# Patient Record
Sex: Female | Born: 1970 | Race: Black or African American | Hispanic: No | Smoking: Never smoker
Health system: Southern US, Community
[De-identification: ages and names within clinical notes are randomized; demographics above are authoritative.]

## PROBLEM LIST (undated history)

## (undated) DIAGNOSIS — E119 Type 2 diabetes mellitus without complications: Secondary | ICD-10-CM

---

## 2009-08-08 ENCOUNTER — Emergency Department (HOSPITAL_COMMUNITY): Admission: EM | Admit: 2009-08-08 | Discharge: 2009-08-08 | Payer: Self-pay | Admitting: Emergency Medicine

## 2009-08-20 ENCOUNTER — Emergency Department (HOSPITAL_COMMUNITY): Admission: EM | Admit: 2009-08-20 | Discharge: 2009-08-20 | Payer: Self-pay | Admitting: Family Medicine

## 2009-09-19 ENCOUNTER — Ambulatory Visit (HOSPITAL_COMMUNITY): Admission: RE | Admit: 2009-09-19 | Discharge: 2009-09-19 | Payer: Self-pay | Admitting: Obstetrics and Gynecology

## 2009-10-15 ENCOUNTER — Ambulatory Visit (HOSPITAL_COMMUNITY): Admission: RE | Admit: 2009-10-15 | Discharge: 2009-10-15 | Payer: Self-pay | Admitting: Obstetrics and Gynecology

## 2010-03-02 ENCOUNTER — Inpatient Hospital Stay (HOSPITAL_COMMUNITY)
Admission: AD | Admit: 2010-03-02 | Discharge: 2010-03-04 | Payer: Self-pay | Source: Home / Self Care | Admitting: Obstetrics and Gynecology

## 2010-06-11 LAB — CBC
Hemoglobin: 9.5 g/dL — ABNORMAL LOW (ref 12.0–15.0)
MCH: 29.2 pg (ref 26.0–34.0)
MCV: 86.8 fL (ref 78.0–100.0)
MCV: 87.5 fL (ref 78.0–100.0)
Platelets: 230 10*3/uL (ref 150–400)
Platelets: 246 10*3/uL (ref 150–400)
RDW: 14.7 % (ref 11.5–15.5)
WBC: 14.4 10*3/uL — ABNORMAL HIGH (ref 4.0–10.5)

## 2010-06-17 LAB — GC/CHLAMYDIA PROBE AMP, GENITAL: GC Probe Amp, Genital: NEGATIVE

## 2010-06-17 LAB — POCT URINALYSIS DIP (DEVICE)
Bilirubin Urine: NEGATIVE
Glucose, UA: NEGATIVE mg/dL
Ketones, ur: 15 mg/dL — AB
Urobilinogen, UA: 0.2 mg/dL (ref 0.0–1.0)
pH: 5.5 (ref 5.0–8.0)

## 2010-06-17 LAB — URINE CULTURE: Culture: NO GROWTH

## 2010-06-17 LAB — WET PREP, GENITAL: Yeast Wet Prep HPF POC: NONE SEEN

## 2010-06-18 LAB — POCT PREGNANCY, URINE: Preg Test, Ur: POSITIVE

## 2014-11-23 ENCOUNTER — Emergency Department (HOSPITAL_COMMUNITY)
Admission: EM | Admit: 2014-11-23 | Discharge: 2014-11-23 | Disposition: A | Discharge status: Home / Self Care | Payer: No Typology Code available for payment source | Attending: Emergency Medicine | Admitting: Emergency Medicine

## 2014-11-23 ENCOUNTER — Encounter (HOSPITAL_COMMUNITY): Payer: Self-pay

## 2014-11-23 ENCOUNTER — Encounter (HOSPITAL_COMMUNITY): Payer: Self-pay | Admitting: Emergency Medicine

## 2014-11-23 ENCOUNTER — Emergency Department (HOSPITAL_COMMUNITY): Payer: No Typology Code available for payment source

## 2014-11-23 ENCOUNTER — Emergency Department (INDEPENDENT_AMBULATORY_CARE_PROVIDER_SITE_OTHER)
Admission: EM | Admit: 2014-11-23 | Discharge: 2014-11-23 | Disposition: A | Discharge status: Other Acute Inpatient Hospital | Payer: No Typology Code available for payment source | Source: Home / Self Care | Attending: Family Medicine | Admitting: Family Medicine

## 2014-11-23 DIAGNOSIS — R0602 Shortness of breath: Secondary | ICD-10-CM | POA: Insufficient documentation

## 2014-11-23 DIAGNOSIS — R42 Dizziness and giddiness: Secondary | ICD-10-CM

## 2014-11-23 DIAGNOSIS — R200 Anesthesia of skin: Secondary | ICD-10-CM

## 2014-11-23 DIAGNOSIS — R2990 Unspecified symptoms and signs involving the nervous system: Secondary | ICD-10-CM

## 2014-11-23 LAB — CBC WITH DIFFERENTIAL/PLATELET
Basophils Absolute: 0 10*3/uL (ref 0.0–0.1)
Basophils Relative: 0 % (ref 0–1)
Eosinophils Absolute: 0.1 10*3/uL (ref 0.0–0.7)
Eosinophils Relative: 1 % (ref 0–5)
HCT: 41.5 % (ref 36.0–46.0)
Hemoglobin: 13.9 g/dL (ref 12.0–15.0)
Lymphocytes Relative: 40 % (ref 12–46)
Lymphs Abs: 3.6 10*3/uL (ref 0.7–4.0)
MCH: 28.4 pg (ref 26.0–34.0)
MCHC: 33.5 g/dL (ref 30.0–36.0)
MCV: 84.7 fL (ref 78.0–100.0)
Monocytes Absolute: 0.6 10*3/uL (ref 0.1–1.0)
Monocytes Relative: 7 % (ref 3–12)
Neutro Abs: 4.8 10*3/uL (ref 1.7–7.7)
Neutrophils Relative %: 52 % (ref 43–77)
Platelets: 296 10*3/uL (ref 150–400)
RBC: 4.9 MIL/uL (ref 3.87–5.11)
RDW: 14.4 % (ref 11.5–15.5)
WBC: 9 10*3/uL (ref 4.0–10.5)

## 2014-11-23 LAB — COMPREHENSIVE METABOLIC PANEL
ALT: 21 U/L (ref 14–54)
AST: 27 U/L (ref 15–41)
Albumin: 4 g/dL (ref 3.5–5.0)
Alkaline Phosphatase: 70 U/L (ref 38–126)
Anion gap: 7 (ref 5–15)
BUN: 7 mg/dL (ref 6–20)
CO2: 27 mmol/L (ref 22–32)
Calcium: 9.5 mg/dL (ref 8.9–10.3)
Chloride: 104 mmol/L (ref 101–111)
Creatinine, Ser: 1.01 mg/dL — ABNORMAL HIGH (ref 0.44–1.00)
GFR calc Af Amer: >60 mL/min (ref >60)
GFR calc non Af Amer: >60 mL/min (ref >60)
Glucose, Bld: 96 mg/dL (ref 65–99)
Potassium: 4.1 mmol/L (ref 3.5–5.1)
Sodium: 138 mmol/L (ref 135–145)
Total Bilirubin: 0.6 mg/dL (ref 0.3–1.2)
Total Protein: 7.7 g/dL (ref 6.5–8.1)

## 2014-11-23 LAB — TROPONIN I: Troponin I: <0.03 ng/mL (ref <0.031)

## 2014-11-23 NOTE — ED Notes (Signed)
Pt denies any pain or discomfort at this time.  Pt alert and oriented x's 3.

## 2014-11-23 NOTE — Discharge Instructions (Signed)

## 2014-11-23 NOTE — ED Notes (Signed)
Earlier today while in a class, experienced numbness in left arm from elbow to hand. Sensation comes and goes. Had brief sweating, dizziness. Admits slightly foggy  Headed. NAD at present

## 2014-11-23 NOTE — ED Notes (Signed)
Pt     Reports    She  Had  An  Episode   Of         Shortness  Of  Breath            And  Some  Weakness  r    Arm      Symptoms  X   5  Hours      Ago             Pt  Is  Awake and  Alert  And  Oriented            Feels  Better  Now       C/o  Some  Shortness  Of  Breath  But  Is  Speaking  In  Complete  sentances

## 2014-11-23 NOTE — ED Provider Notes (Signed)
CSN: [id number]     Arrival date & time 11/23/14  1810 History   First MD Initiated Contact with Patient 11/23/14 1825     Chief Complaint  Patient presents with  . Dizziness   (Consider location/radiation/quality/duration/timing/severity/associated sxs/prior Treatment) Patient is a 44 y.o. female presenting with dizziness. The history is provided by the patient.  Dizziness Quality:  Lightheadedness Severity:  Mild Duration:  5 hours Progression:  Waxing and waning Chronicity:  New Relieved by:  None tried Ineffective treatments:  None tried Associated symptoms: headaches and shortness of breath   Associated symptoms: no chest pain, no palpitations, no syncope, no vision changes and no weakness   Associated symptoms comment:  Became confused when driving, onset while in class at school today, denies stress Risk factors: no new medications     History reviewed. No pertinent past medical history. History reviewed. No pertinent past surgical history. History reviewed. No pertinent family history. Social History  Substance Use Topics  . Smoking status: Never Smoker   . Smokeless tobacco: None  . Alcohol Use: No   OB History    No data available     Review of Systems  Constitutional: Negative.   Respiratory: Positive for shortness of breath.   Cardiovascular: Negative for chest pain, palpitations and syncope.  Neurological: Positive for dizziness, numbness and headaches. Negative for seizures, facial asymmetry, speech difficulty and weakness.    Allergies  Review of patient's allergies indicates no known allergies.  Home Medications   Prior to Admission medications   Not on File   Meds Ordered and Administered this Visit  Medications - No data to display  BP 130/83 mmHg  Temp(Src) 98.8 F (37.1 C) (Oral)  SpO2 100%  LMP 11/09/2014 (Exact Date) No data found.   Physical Exam  Constitutional: She is oriented to person, place, and time. She appears  well-developed and well-nourished. No distress.  HENT:  Head: Normocephalic and atraumatic.  Neck: Normal range of motion. Neck supple.  Cardiovascular: Normal heart sounds and intact distal pulses.   Pulmonary/Chest: Effort normal and breath sounds normal.  Musculoskeletal: Normal range of motion.  Lymphadenopathy:    She has no cervical adenopathy.  Neurological: She is alert and oriented to person, place, and time. She displays normal reflexes. No cranial nerve deficit. Coordination normal.  Motor strength 2+ symm throughout.  Skin: Skin is warm and dry.  Nursing note and vitals reviewed.   ED Course  Procedures (including critical care time)  Labs Review Labs Reviewed - No data to display  Imaging Review No results found.   Visual Acuity Review  Right Eye Distance:   Left Eye Distance:   Bilateral Distance:    Right Eye Near:   Left Eye Near:    Bilateral Near:         MDM   1. Neurological complaint   sent for consideration of neuro eval of sx. Off and on since 1pm today.    Linna Hoff, MD 11/23/14 [phone]

## 2014-11-23 NOTE — ED Provider Notes (Signed)
CSN: [id number]     Arrival date & time 11/23/14  1921 History   First MD Initiated Contact with Patient 11/23/14 2031     Chief Complaint  Patient presents with  . Shortness of Breath  . Dizziness     (Consider location/radiation/quality/duration/timing/severity/associated sxs/prior Treatment) HPI   44 year old female with lightheadedness, mild shortness of breath and some numbness/tingling in her left forearm. Onset while sitting in class early this morning. Persisted for about half an hour but slowly improved but not quite to the point of complete resolution. Afterwards she was driving when symptoms began intensifying again. Again slowly subsided now currently resolved. She was evaluated at urgent care then refer to emergency room for further evaluation. Denies any chest pain. No dizziness, lightheadedness, diaphoresis or palpitations. No fevers or chills. No unusual leg pain or swelling. No visual changes. No focal weakness. No history of prior TIA or CVA. No known history of coronary artery disease. Has never had stress testing. No history similar type symptoms. She has not had any similar symptoms with exertion over the past several days to weeks.  History reviewed. No pertinent past medical history. Past Surgical History  Procedure Laterality Date  . Cesarean section     No family history on file. Social History  Substance Use Topics  . Smoking status: Never Smoker   . Smokeless tobacco: None  . Alcohol Use: No   OB History    No data available     Review of Systems  All systems reviewed and negative, other than as noted in HPI.   Allergies  Review of patient's allergies indicates no known allergies.  Home Medications   Prior to Admission medications   Not on File   BP 145/86 mmHg  Pulse 81  Temp(Src) 97.8 F (36.6 C) (Oral)  Resp 16  Ht  (1.651 m)  Wt 190 lb (86.183 kg)  BMI 31.62 kg/m2  SpO2 97%  LMP 11/09/2014 (Exact Date) Physical Exam   Constitutional: She is oriented to person, place, and time. She appears well-developed and well-nourished. No distress.  HENT:  Head: Normocephalic and atraumatic.  Eyes: Conjunctivae are normal. Right eye exhibits no discharge. Left eye exhibits no discharge.  Neck: Neck supple.  Cardiovascular: Normal rate, regular rhythm and normal heart sounds.  Exam reveals no gallop and no friction rub.   No murmur heard. Pulmonary/Chest: Effort normal and breath sounds normal. No respiratory distress.  Abdominal: Soft. She exhibits no distension. There is no tenderness.  Musculoskeletal: She exhibits no edema or tenderness.  Lower extremities symmetric as compared to each other. No calf tenderness. Negative Homan's. No palpable cords.   Neurological: She is alert and oriented to person, place, and time. No cranial nerve deficit. She exhibits normal muscle tone. Coordination normal.  Cranial nerves intact. Strength is 5 out of 5 bilateral upper lower extremities. Sensation is intact to light touch. Normal biceps and patellar reflexes. Good finger to nose testing bilaterally. Gait is steady.  Skin: Skin is warm and dry.  Psychiatric: She has a normal mood and affect. Her behavior is normal. Thought content normal.  Nursing note and vitals reviewed.   ED Course  Procedures (including critical care time) Labs Review Labs Reviewed  COMPREHENSIVE METABOLIC PANEL - Abnormal; Notable for the following:    Creatinine, Ser 1.01 (*)    All other components within normal limits  CBC WITH DIFFERENTIAL/PLATELET  TROPONIN I  POC URINE PREG, ED    Imaging Review Dg Chest 2  View  11/23/2014   CLINICAL DATA:  Shortness of breath and syncope for 1 day.  EXAM: CHEST  2 VIEW  COMPARISON:  None.  FINDINGS: The heart size and mediastinal contours are within normal limits. Both lungs are clear. The visualized skeletal structures are unremarkable.  IMPRESSION: No active cardiopulmonary disease.   Electronically  Signed   By: Burman Nieves M.D.   On: 11/23/2014 20:08   I have personally reviewed and evaluated these images and lab results as part of my medical decision-making.   EKG Interpretation   Date/Time:  Thursday November 23 2014 19:40:04 EDT Ventricular Rate:  75 PR Interval:  178 QRS Duration: 88 QT Interval:  390 QTC Calculation: 435 R Axis:   75 Text Interpretation:  Normal sinus rhythm Non-specific ST-t changes No old  tracing to compare Confirmed by Devone Bonilla  MD, [place]efe ([id number]) on 11/23/2014  8:33:26 PM      MDM   Final diagnoses:  Arm numbness left  Dizziness    44yF with nausea, dizziness and numbness in L forearm. Currently symptoms free. Non focal neuro exam. Doubt TIA. Her numbness seems most consistent with a peripheral nerve issue if truly neurologic. Consider ACS, but atypical symptoms. Fairly normal appearing EKG. Troponin normal >8 hours after initial symptoms. HEART score of 2. I feel appropriate for outpt FU. Discussed need to speak with PCP about possible stress testing.     Raeford Razor, MD 11/30/14 [phone]

## 2014-11-23 NOTE — ED Notes (Signed)
Pt. reports SOB with dizziness and numbness at left forearm onset today , alert and oriented / speech clear , equal grips with no arm drift. No cough or congestion .

## 2015-11-07 ENCOUNTER — Encounter: Payer: Self-pay | Admitting: Obstetrics & Gynecology

## 2015-11-07 ENCOUNTER — Ambulatory Visit (INDEPENDENT_AMBULATORY_CARE_PROVIDER_SITE_OTHER): Payer: Medicaid Other | Admitting: Obstetrics & Gynecology

## 2015-11-07 VITALS — BP 123/79 | HR 78 | Temp 97.4°F | Ht 65.0 in | Wt 192.1 lb

## 2015-11-07 DIAGNOSIS — Z30430 Encounter for insertion of intrauterine contraceptive device: Secondary | ICD-10-CM

## 2015-11-07 DIAGNOSIS — Z30433 Encounter for removal and reinsertion of intrauterine contraceptive device: Secondary | ICD-10-CM | POA: Diagnosis not present

## 2015-11-07 NOTE — Progress Notes (Signed)
GYNECOLOGY CLINIC PROCEDURE NOTE   [name] is a 45 y.o. [id number] here for Mirena IUD removal and  insertion. No GYN concerns.  Last pap smear was in 2016 and was normal per patient.  Her IUD was placed 6 years prev and she did not have insurance to have it replaced.  No problems reports with her IUD.    Patient identified, informed consent performed.  Discussed risks of irregular bleeding, cramping, infection, malpositioning or misplacement of the IUD outside the uterus which may require further procedures. Time out was performed.   Patient was in the dorsal lithotomy position, normal external genitalia was noted.  A speculum was placed in the patient's vagina, normal discharge was noted, no lesions. The multiparous cervix was visualized, no lesions, no abnormal discharge;  and the cervix was swabbed with Betadine using scopettes. The strings of the IUD were grasped and pulled using ring forceps.  The IUD was successfully removed in its entirety.    The cervix was visualized.  Cleaned with Betadine x 2.  Grasped anteriorly with a single tooth tenaculum.  Uterus sounded to 9 cm.  Mirena IUD placed per manufacturer's recommendations.  Strings trimmed to 3 cm. Tenaculum was removed, good hemostasis noted.  Patient tolerated procedure well.   Patient was given post-procedure instructions.  Patient was also asked to follow up in 4 weeks for IUD check.  Timber Lucarelli L. Harraway-Smith, M.D., [facility] CoreFACOG

## 2015-11-07 NOTE — Patient Instructions (Signed)
Levonorgestrel intrauterine device (IUD) What is this medicine? LEVONORGESTREL IUD (LEE voe nor jes trel) is a contraceptive (birth control) device. The device is placed inside the uterus by a healthcare professional. It is used to prevent pregnancy and can also be used to treat heavy bleeding that occurs during your period. Depending on the device, it can be used for 3 to 5 years. This medicine may be used for other purposes; ask your health care provider or pharmacist if you have questions. What should I tell my health care provider before I take this medicine? They need to know if you have any of these conditions: -abnormal Pap smear -cancer of the breast, uterus, or cervix -diabetes -endometritis -genital or pelvic infection now or in the past -have more than one sexual partner or your partner has more than one partner -heart disease -history of an ectopic or tubal pregnancy -immune system problems -IUD in place -liver disease or tumor -problems with blood clots or take blood-thinners -use intravenous drugs -uterus of unusual shape -vaginal bleeding that has not been explained -an unusual or allergic reaction to levonorgestrel, other hormones, silicone, or polyethylene, medicines, foods, dyes, or preservatives -pregnant or trying to get pregnant -breast-feeding How should I use this medicine? This device is placed inside the uterus by a health care professional. Talk to your pediatrician regarding the use of this medicine in children. Special care may be needed. Overdosage: If you think you have taken too much of this medicine contact a poison control center or emergency room at once. NOTE: This medicine is only for you. Do not share this medicine with others. What if I miss a dose? This does not apply. What may interact with this medicine? Do not take this medicine with any of the following medications: -amprenavir -bosentan -fosamprenavir This medicine may also interact with  the following medications: -aprepitant -barbiturate medicines for inducing sleep or treating seizures -bexarotene -griseofulvin -medicines to treat seizures like carbamazepine, ethotoin, felbamate, oxcarbazepine, phenytoin, topiramate -modafinil -pioglitazone -rifabutin -rifampin -rifapentine -some medicines to treat HIV infection like atazanavir, indinavir, lopinavir, nelfinavir, tipranavir, ritonavir -St. John's wort -warfarin This list may not describe all possible interactions. Give your health care provider a list of all the medicines, herbs, non-prescription drugs, or dietary supplements you use. Also tell them if you smoke, drink alcohol, or use illegal drugs. Some items may interact with your medicine. What should I watch for while using this medicine? Visit your doctor or health care professional for regular check ups. See your doctor if you or your partner has sexual contact with others, becomes HIV positive, or gets a sexual transmitted disease. This product does not protect you against HIV infection (AIDS) or other sexually transmitted diseases. You can check the placement of the IUD yourself by reaching up to the top of your vagina with clean fingers to feel the threads. Do not pull on the threads. It is a good habit to check placement after each menstrual period. Call your doctor right away if you feel more of the IUD than just the threads or if you cannot feel the threads at all. The IUD may come out by itself. You may become pregnant if the device comes out. If you notice that the IUD has come out use a backup birth control method like condoms and call your health care provider. Using tampons will not change the position of the IUD and are okay to use during your period. What side effects may I notice from receiving this medicine?   Side effects that you should report to your doctor or health care professional as soon as possible: -allergic reactions like skin rash, itching or  hives, swelling of the face, lips, or tongue -fever, flu-like symptoms -genital sores -high blood pressure -no menstrual period for 6 weeks during use -pain, swelling, warmth in the leg -pelvic pain or tenderness -severe or sudden headache -signs of pregnancy -stomach cramping -sudden shortness of breath -trouble with balance, talking, or walking -unusual vaginal bleeding, discharge -yellowing of the eyes or skin Side effects that usually do not require medical attention (report to your doctor or health care professional if they continue or are bothersome): -acne -breast pain -change in sex drive or performance -changes in weight -cramping, dizziness, or faintness while the device is being inserted -headache -irregular menstrual bleeding within first 3 to 6 months of use -nausea This list may not describe all possible side effects. Call your doctor for medical advice about side effects. You may report side effects to FDA at 1-800-FDA-1088. Where should I keep my medicine? This does not apply. NOTE: This sheet is a summary. It may not cover all possible information. If you have questions about this medicine, talk to your doctor, pharmacist, or health care provider.    2016, Elsevier/Gold Standard. (2011-04-17 13:54:04) Intrauterine Device Insertion, Care After Refer to this sheet in the next few weeks. These instructions provide you with information on caring for yourself after your procedure. Your health care provider may also give you more specific instructions. Your treatment has been planned according to current medical practices, but problems sometimes occur. Call your health care provider if you have any problems or questions after your procedure. WHAT TO EXPECT AFTER THE PROCEDURE Insertion of the IUD may cause some discomfort, such as cramping. The cramping should improve after the IUD is in place. You may have bleeding after the procedure. This is normal. It varies from light  spotting for a few days to menstrual-like bleeding. When the IUD is in place, a string will extend past the cervix into the vagina for 1-2 inches. The strings should not bother you or your partner. If they do, talk to your health care provider.  HOME CARE INSTRUCTIONS   Check your intrauterine device (IUD) to make sure it is in place before you resume sexual activity. You should be able to feel the strings. If you cannot feel the strings, something may be wrong. The IUD may have fallen out of the uterus, or the uterus may have been punctured (perforated) during placement. Also, if the strings are getting longer, it may mean that the IUD is being forced out of the uterus. You no longer have full protection from pregnancy if any of these problems occur.  You may resume sexual intercourse if you are not having problems with the IUD. The copper IUD is considered immediately effective, and the hormone IUD works right away if inserted within 7 days of your period starting. You will need to use a backup method of birth control for 7 days if the IUD in inserted at any other time in your cycle.  Continue to check that the IUD is still in place by feeling for the strings after every menstrual period.  You may need to take pain medicine such as acetaminophen or ibuprofen. Only take medicines as directed by your health care provider. SEEK MEDICAL CARE IF:   You have bleeding that is heavier or lasts longer than a normal menstrual cycle.  You have a fever.    You have increasing cramps or abdominal pain not relieved with medicine.  You have abdominal pain that does not seem to be related to the same area of earlier cramping and pain.  You are lightheaded, unusually weak, or faint.  You have abnormal vaginal discharge or smells.  You have pain during sexual intercourse.  You cannot feel the IUD strings, or the IUD string has gotten longer.  You feel the IUD at the opening of the cervix in the  vagina.  You think you are pregnant, or you miss your menstrual period.  The IUD string is hurting your sex partner. MAKE SURE YOU:  Understand these instructions.  Will watch your condition.  Will get help right away if you are not doing well or get worse.   This information is not intended to replace advice given to you by your health care provider. Make sure you discuss any questions you have with your health care provider.   Document Released: 11/13/2010 Document Revised: 01/05/2013 Document Reviewed: 09/05/2012 Elsevier Interactive Patient Education 2016 Elsevier Inc.  

## 2015-12-05 ENCOUNTER — Ambulatory Visit: Payer: Medicaid Other | Admitting: Obstetrics and Gynecology

## 2015-12-05 ENCOUNTER — Ambulatory Visit: Payer: Medicaid Other

## 2016-05-19 ENCOUNTER — Ambulatory Visit: Payer: Self-pay | Admitting: Obstetrics and Gynecology

## 2017-01-16 ENCOUNTER — Other Ambulatory Visit: Payer: Self-pay | Admitting: Nurse Practitioner

## 2017-01-16 DIAGNOSIS — Z1231 Encounter for screening mammogram for malignant neoplasm of breast: Secondary | ICD-10-CM

## 2017-02-11 ENCOUNTER — Ambulatory Visit: Payer: Self-pay

## 2017-02-24 ENCOUNTER — Ambulatory Visit
Admission: RE | Admit: 2017-02-24 | Discharge: 2017-02-24 | Disposition: A | Discharge status: Home / Self Care | Payer: No Typology Code available for payment source | Source: Ambulatory Visit | Attending: Nurse Practitioner | Admitting: Nurse Practitioner

## 2017-02-24 DIAGNOSIS — Z1231 Encounter for screening mammogram for malignant neoplasm of breast: Secondary | ICD-10-CM

## 2017-03-26 ENCOUNTER — Ambulatory Visit: Payer: Self-pay

## 2019-12-01 ENCOUNTER — Other Ambulatory Visit: Payer: Self-pay | Admitting: Physician Assistant

## 2019-12-01 DIAGNOSIS — Z1231 Encounter for screening mammogram for malignant neoplasm of breast: Secondary | ICD-10-CM

## 2019-12-20 ENCOUNTER — Ambulatory Visit
Admission: RE | Admit: 2019-12-20 | Discharge: 2019-12-20 | Disposition: A | Discharge status: Home / Self Care | Payer: Medicaid Other | Source: Ambulatory Visit | Attending: Physician Assistant | Admitting: Physician Assistant

## 2019-12-20 ENCOUNTER — Other Ambulatory Visit: Payer: Self-pay

## 2019-12-20 DIAGNOSIS — Z1231 Encounter for screening mammogram for malignant neoplasm of breast: Secondary | ICD-10-CM

## 2019-12-22 ENCOUNTER — Other Ambulatory Visit: Payer: Self-pay | Admitting: Physician Assistant

## 2019-12-22 DIAGNOSIS — R928 Other abnormal and inconclusive findings on diagnostic imaging of breast: Secondary | ICD-10-CM

## 2020-01-09 ENCOUNTER — Ambulatory Visit: Payer: Medicaid Other

## 2020-01-09 ENCOUNTER — Ambulatory Visit
Admission: RE | Admit: 2020-01-09 | Discharge: 2020-01-09 | Disposition: A | Discharge status: Home / Self Care | Payer: Medicaid Other | Source: Ambulatory Visit | Attending: Physician Assistant | Admitting: Physician Assistant

## 2020-01-09 ENCOUNTER — Other Ambulatory Visit: Payer: Self-pay

## 2020-01-09 DIAGNOSIS — R928 Other abnormal and inconclusive findings on diagnostic imaging of breast: Secondary | ICD-10-CM

## 2021-12-22 IMAGING — MG MM DIGITAL DIAGNOSTIC UNILAT*L* W/ TOMO W/ CAD
4 series · 4 of 12 positions shown · non-contrast
Comparison: Previous exam(s).

CLINICAL DATA: Possible left breast asymmetry is on a recent 2D
screening mammogram.

EXAM:
DIGITAL DIAGNOSTIC UNILATERAL LEFT MAMMOGRAM WITH TOMO AND CAD

[L MLO synth-2D]
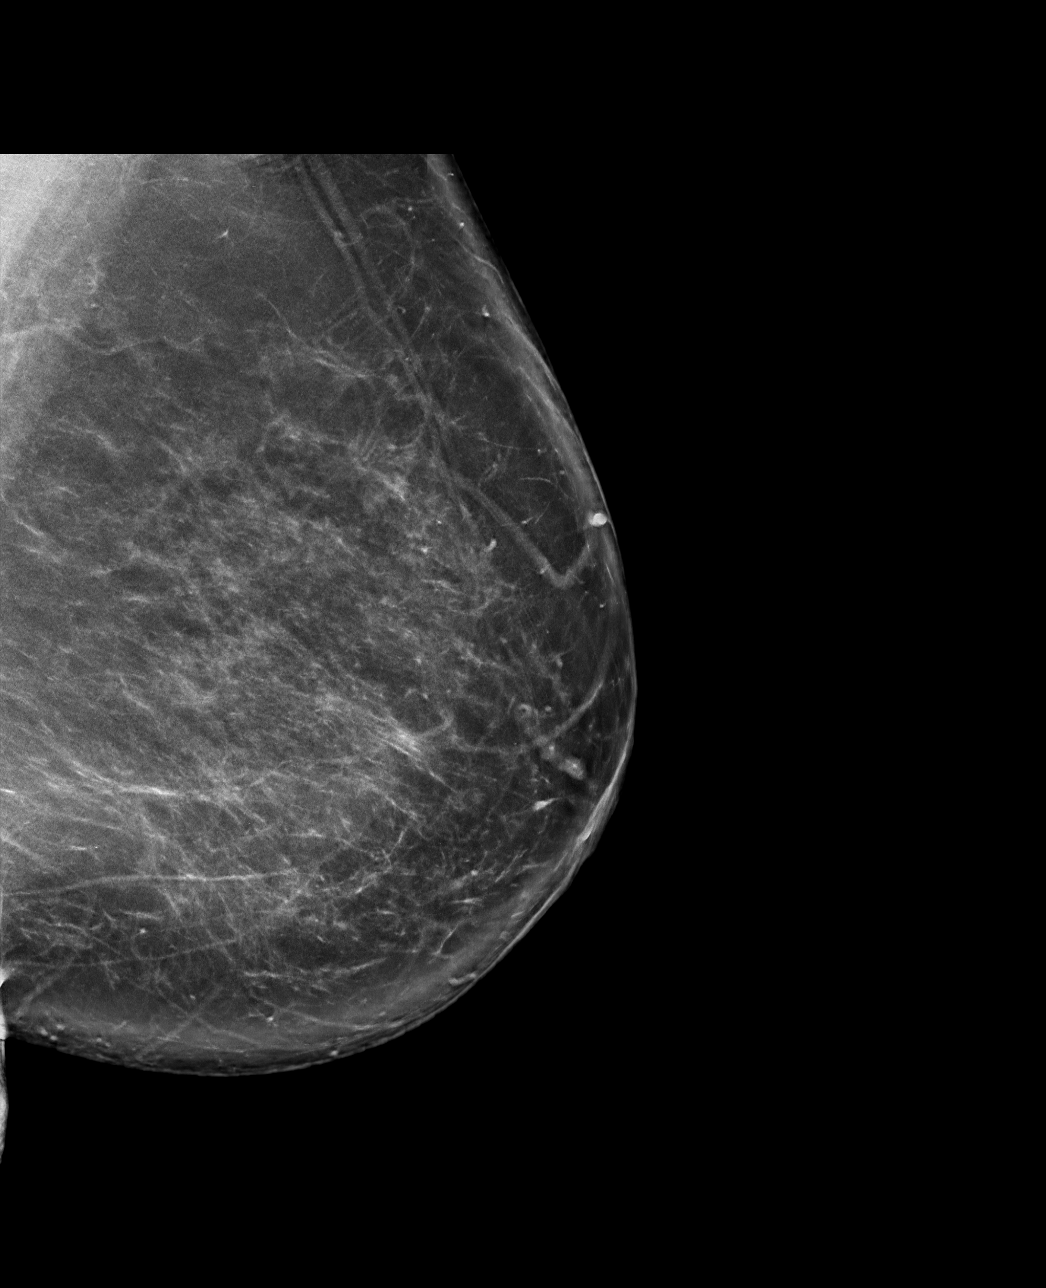

[L CC synth-2D]
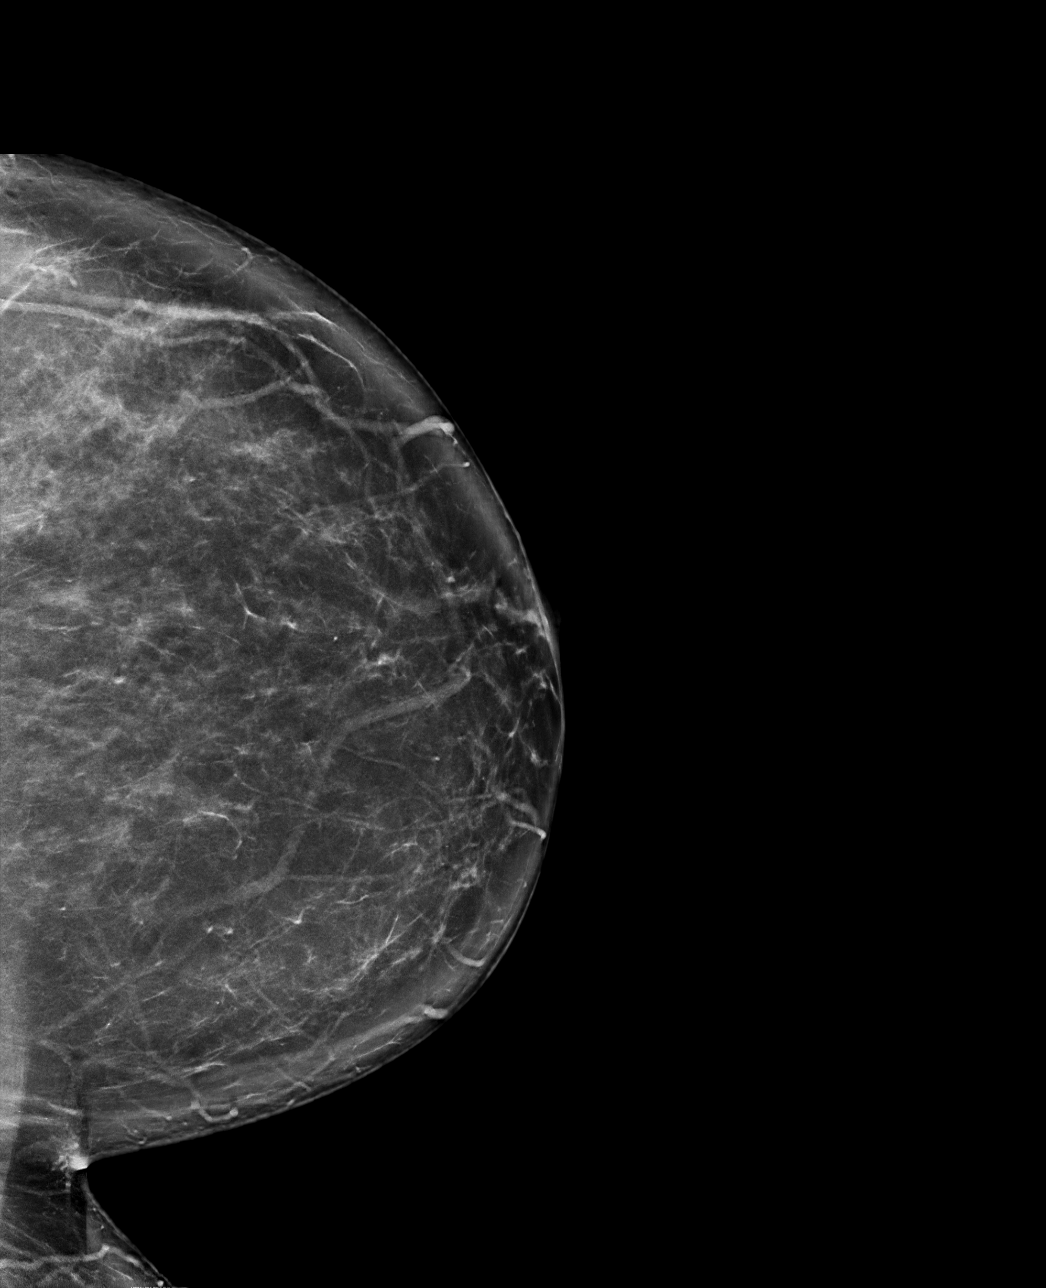

[L CC tomo · tomo slice 49/96.0]
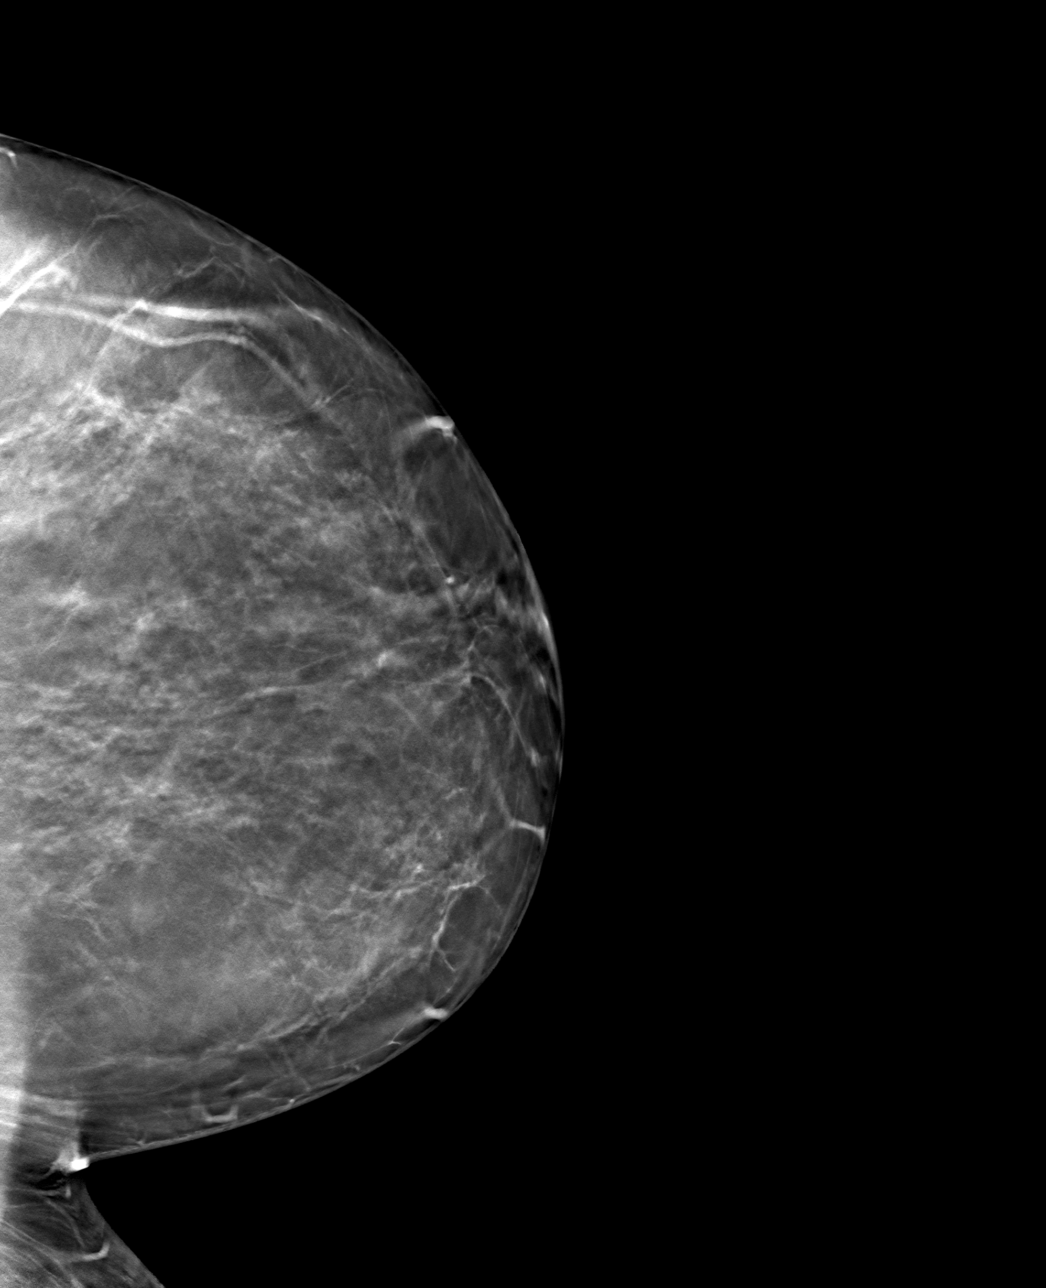

[L MLO tomo · tomo slice 55/109.0]
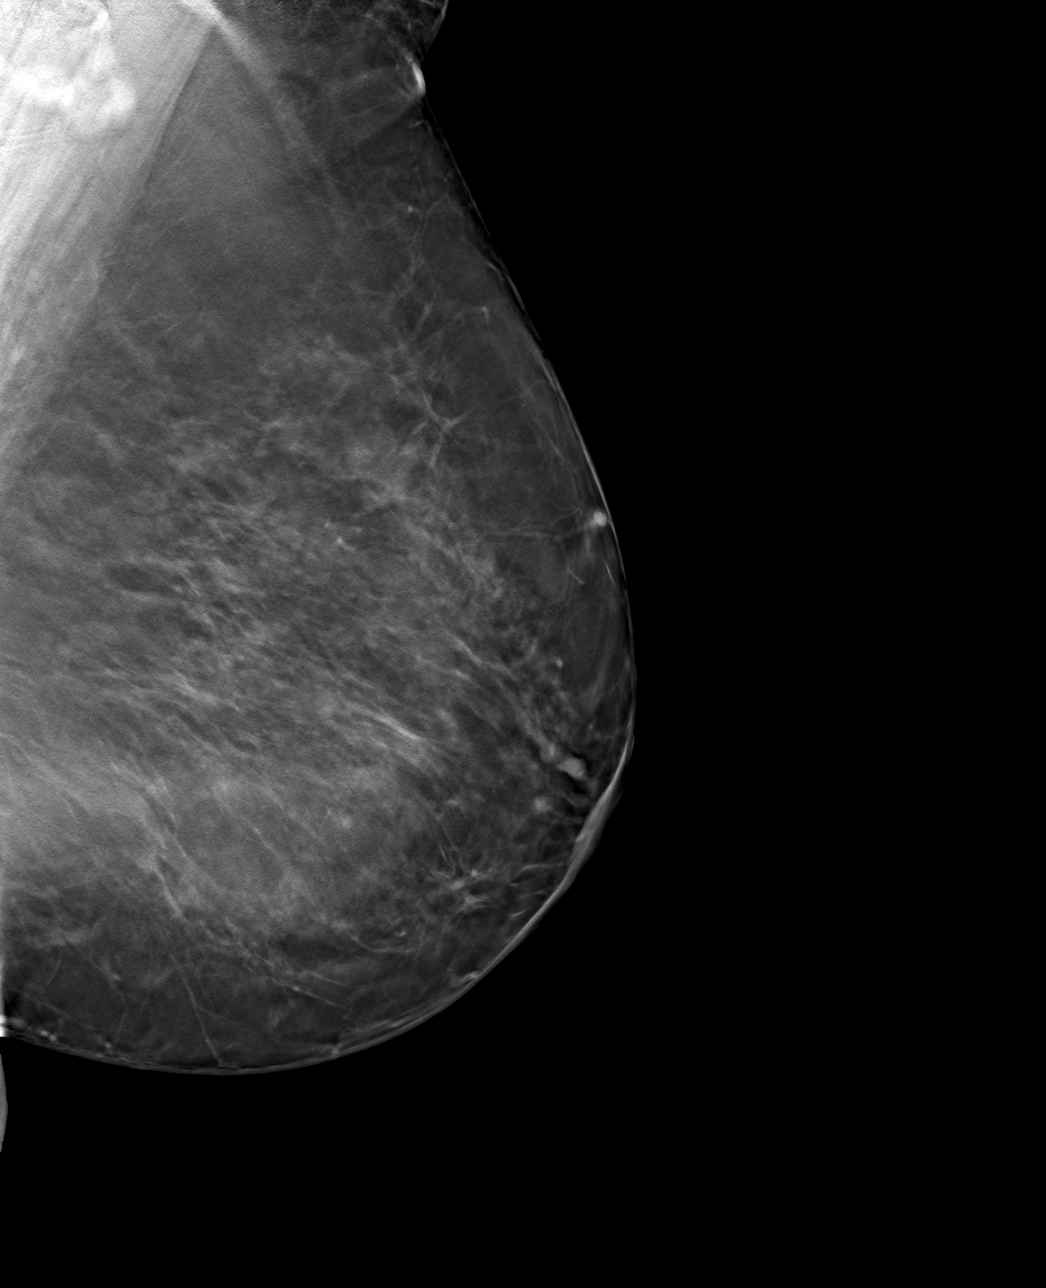

[4 of 12 positions shown; findings below may reference images not displayed]

ACR Breast Density Category b: There are scattered areas of
fibroglandular density.
FINDINGS: 3D tomographic and 2D generated mammographic views of the left
breast demonstrate normal appearing fibroglandular tissue at the
locations of the recently suspected asymmetries. No asymmetries or
other findings suspicious for malignancy are seen.

Mammographic images were processed with CAD.
IMPRESSION: No evidence of malignancy. The recently suspected left breast
asymmetries were areas of overlapping of normal breast tissue.

RECOMMENDATION:
Bilateral screening mammogram in 1 year.

I have discussed the findings and recommendations with the patient.
If applicable, a reminder letter will be sent to the patient
regarding the next appointment.

BI-RADS CATEGORY  1: Negative.

## 2022-07-24 ENCOUNTER — Ambulatory Visit: Payer: Medicaid Other | Admitting: Obstetrics and Gynecology

## 2022-09-16 ENCOUNTER — Ambulatory Visit: Payer: Medicaid Other | Admitting: Obstetrics and Gynecology

## 2024-04-09 ENCOUNTER — Ambulatory Visit: Admission: EM | Admit: 2024-04-09 | Discharge: 2024-04-09 | Disposition: A | Discharge status: Home / Self Care

## 2024-04-09 ENCOUNTER — Encounter: Payer: Self-pay | Admitting: *Deleted

## 2024-04-09 DIAGNOSIS — S39012A Strain of muscle, fascia and tendon of lower back, initial encounter: Secondary | ICD-10-CM

## 2024-04-09 DIAGNOSIS — S161XXA Strain of muscle, fascia and tendon at neck level, initial encounter: Secondary | ICD-10-CM | POA: Diagnosis not present

## 2024-04-09 DIAGNOSIS — S56911A Strain of unspecified muscles, fascia and tendons at forearm level, right arm, initial encounter: Secondary | ICD-10-CM | POA: Diagnosis not present

## 2024-04-09 HISTORY — DX: Type 2 diabetes mellitus without complications: E11.9

## 2024-04-09 MED ORDER — CYCLOBENZAPRINE HCL 5 MG PO TABS: 5.0000 mg | ORAL_TABLET | Freq: Three times a day (TID) | ORAL | 0 refills | Status: AC | PRN

## 2024-04-09 MED ORDER — DICLOFENAC SODIUM 75 MG PO TBEC: 75.0000 mg | DELAYED_RELEASE_TABLET | Freq: Two times a day (BID) | ORAL | 0 refills | Status: AC
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
## 2024-04-09 NOTE — Discharge Instructions (Signed)
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
## 2024-04-21 ENCOUNTER — Other Ambulatory Visit: Payer: Self-pay

## 2024-04-21 DIAGNOSIS — G8911 Acute pain due to trauma: Secondary | ICD-10-CM

## 2024-05-13 ENCOUNTER — Other Ambulatory Visit
